# Patient Record
Sex: Female | Born: 1980 | Race: Black or African American | Hispanic: No | Marital: Single | Smoking: Never smoker
Health system: Southern US, Community
[De-identification: ages and names within clinical notes are randomized; demographics above are authoritative.]

## PROBLEM LIST (undated history)

## (undated) DIAGNOSIS — I10 Essential (primary) hypertension: Secondary | ICD-10-CM

## (undated) HISTORY — PX: GASTRIC BYPASS: SHX52

## (undated) HISTORY — PX: CHOLECYSTECTOMY: SHX55

---

## 2005-05-29 HISTORY — PX: ABDOMINAL HYSTERECTOMY: SHX81

## 2016-10-28 ENCOUNTER — Encounter (HOSPITAL_BASED_OUTPATIENT_CLINIC_OR_DEPARTMENT_OTHER): Payer: Self-pay | Admitting: Emergency Medicine

## 2016-10-28 ENCOUNTER — Emergency Department (HOSPITAL_BASED_OUTPATIENT_CLINIC_OR_DEPARTMENT_OTHER)
Admission: EM | Admit: 2016-10-28 | Discharge: 2016-10-28 | Disposition: A | Discharge status: Home / Self Care | Payer: No Typology Code available for payment source | Attending: Emergency Medicine | Admitting: Emergency Medicine

## 2016-10-28 DIAGNOSIS — I10 Essential (primary) hypertension: Secondary | ICD-10-CM | POA: Diagnosis not present

## 2016-10-28 DIAGNOSIS — G500 Trigeminal neuralgia: Secondary | ICD-10-CM | POA: Insufficient documentation

## 2016-10-28 DIAGNOSIS — Z79899 Other long term (current) drug therapy: Secondary | ICD-10-CM | POA: Diagnosis not present

## 2016-10-28 DIAGNOSIS — G518 Other disorders of facial nerve: Secondary | ICD-10-CM

## 2016-10-28 DIAGNOSIS — Y929 Unspecified place or not applicable: Secondary | ICD-10-CM | POA: Diagnosis not present

## 2016-10-28 DIAGNOSIS — M549 Dorsalgia, unspecified: Secondary | ICD-10-CM | POA: Diagnosis present

## 2016-10-28 DIAGNOSIS — Y999 Unspecified external cause status: Secondary | ICD-10-CM | POA: Insufficient documentation

## 2016-10-28 DIAGNOSIS — Y939 Activity, unspecified: Secondary | ICD-10-CM | POA: Diagnosis not present

## 2016-10-28 HISTORY — DX: Essential (primary) hypertension: I10

## 2016-10-28 MED ORDER — ACETAMINOPHEN 500 MG PO TABS: 1000.0000 mg | ORAL_TABLET | Freq: Four times a day (QID) | ORAL | 0 refills | Status: DC | PRN

## 2016-10-28 MED ORDER — LISINOPRIL-HYDROCHLOROTHIAZIDE 20-12.5 MG PO TABS: 1.0000 | ORAL_TABLET | Freq: Every day | ORAL | 0 refills | Status: AC
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
## 2016-10-28 NOTE — ED Notes (Signed)
Pt reports she recently moved here from [place] - she is prescribed Lisinopril/HCTZ for her BP but has not had med since moving, unable to obtain prescription. [place]

## 2016-12-25 ENCOUNTER — Emergency Department (HOSPITAL_COMMUNITY)
Admission: EM | Admit: 2016-12-25 | Discharge: 2016-12-25 | Disposition: A | Discharge status: Home / Self Care | Payer: BLUE CROSS/BLUE SHIELD | Attending: Emergency Medicine | Admitting: Emergency Medicine

## 2016-12-25 ENCOUNTER — Encounter (HOSPITAL_COMMUNITY): Payer: Self-pay | Admitting: Emergency Medicine

## 2016-12-25 ENCOUNTER — Emergency Department (HOSPITAL_COMMUNITY): Payer: BLUE CROSS/BLUE SHIELD

## 2016-12-25 DIAGNOSIS — Z79899 Other long term (current) drug therapy: Secondary | ICD-10-CM | POA: Diagnosis not present

## 2016-12-25 DIAGNOSIS — R001 Bradycardia, unspecified: Secondary | ICD-10-CM | POA: Diagnosis not present

## 2016-12-25 DIAGNOSIS — N83202 Unspecified ovarian cyst, left side: Secondary | ICD-10-CM | POA: Diagnosis not present

## 2016-12-25 DIAGNOSIS — R102 Pelvic and perineal pain: Secondary | ICD-10-CM

## 2016-12-25 DIAGNOSIS — R1032 Left lower quadrant pain: Secondary | ICD-10-CM | POA: Diagnosis present

## 2016-12-25 DIAGNOSIS — I10 Essential (primary) hypertension: Secondary | ICD-10-CM | POA: Diagnosis not present

## 2016-12-25 LAB — URINALYSIS, ROUTINE W REFLEX MICROSCOPIC
BILIRUBIN URINE: NEGATIVE
GLUCOSE, UA: NEGATIVE mg/dL
HGB URINE DIPSTICK: NEGATIVE
KETONES UR: NEGATIVE mg/dL
Leukocytes, UA: NEGATIVE
Nitrite: NEGATIVE
PH: 5 (ref 5.0–8.0)
Protein, ur: NEGATIVE mg/dL
SPECIFIC GRAVITY, URINE: 1.016 (ref 1.005–1.030)

## 2016-12-25 LAB — WET PREP, GENITAL
SPERM: NONE SEEN
Trich, Wet Prep: NONE SEEN
Yeast Wet Prep HPF POC: NONE SEEN

## 2016-12-25 LAB — COMPREHENSIVE METABOLIC PANEL
ALK PHOS: 36 U/L — AB (ref 38–126)
ALT: 11 U/L — AB (ref 14–54)
AST: 13 U/L — ABNORMAL LOW (ref 15–41)
Albumin: 3.7 g/dL (ref 3.5–5.0)
Anion gap: 7 (ref 5–15)
BUN: 8 mg/dL (ref 6–20)
CALCIUM: 9 mg/dL (ref 8.9–10.3)
CHLORIDE: 108 mmol/L (ref 101–111)
CO2: 26 mmol/L (ref 22–32)
CREATININE: 0.71 mg/dL (ref 0.44–1.00)
GFR calc Af Amer: >60 mL/min (ref >60)
GFR calc non Af Amer: >60 mL/min (ref >60)
GLUCOSE: 100 mg/dL — AB (ref 65–99)
Potassium: 3.7 mmol/L (ref 3.5–5.1)
Sodium: 141 mmol/L (ref 135–145)
Total Bilirubin: 0.8 mg/dL (ref 0.3–1.2)
Total Protein: 6.5 g/dL (ref 6.5–8.1)

## 2016-12-25 LAB — LIPASE, BLOOD: Lipase: 24 U/L (ref 11–51)

## 2016-12-25 LAB — GC/CHLAMYDIA PROBE AMP (~~LOC~~) NOT AT ARMC
Chlamydia: NEGATIVE
NEISSERIA GONORRHEA: NEGATIVE

## 2016-12-25 LAB — CBC
HCT: 34.4 % — ABNORMAL LOW (ref 36.0–46.0)
Hemoglobin: 11.4 g/dL — ABNORMAL LOW (ref 12.0–15.0)
MCH: 28.6 pg (ref 26.0–34.0)
MCHC: 33.1 g/dL (ref 30.0–36.0)
MCV: 86.2 fL (ref 78.0–100.0)
PLATELETS: 189 10*3/uL (ref 150–400)
RBC: 3.99 MIL/uL (ref 3.87–5.11)
RDW: 12.6 % (ref 11.5–15.5)
WBC: 8.3 10*3/uL (ref 4.0–10.5)

## 2016-12-25 MED ORDER — HYDROCODONE-ACETAMINOPHEN 5-325 MG PO TABS
1.0000 | ORAL_TABLET | Freq: Once | ORAL | Status: AC
  Administered 2016-12-25: 1 via ORAL
  Filled 2016-12-25: qty 1

## 2016-12-25 MED ORDER — NAPROXEN 500 MG PO TABS: 500.0000 mg | ORAL_TABLET | Freq: Two times a day (BID) | ORAL | 0 refills | Status: AC

## 2016-12-25 MED ORDER — ONDANSETRON 4 MG PO TBDP
4.0000 mg | ORAL_TABLET | Freq: Once | ORAL | Status: AC
  Administered 2016-12-25: 4 mg via ORAL
  Filled 2016-12-25: qty 1

## 2016-12-25 MED ORDER — HYDROCODONE-ACETAMINOPHEN 5-325 MG PO TABS: ORAL_TABLET | ORAL | 0 refills | Status: AC

## 2016-12-25 NOTE — ED Notes (Signed)
I will come back and draw labs,  Provider at bedside.

## 2016-12-25 NOTE — ED Provider Notes (Signed)
[facility] Provider Note   CSN: [id number] Arrival date & time: 12/25/16  4098     History   Chief Complaint Chief Complaint  Patient presents with  . Abdominal Pain    HPI Robin Lester is a 36 y.o. female.  Patient with history of cholecystectomy, hysterectomy due to vaginal bleeding after cesarean section, gastric sleeve -- presents with complaint of left lower abdominal/pelvic pain starting acutely at 3 AM. Pain woke patient from sleep. She was able to go back to sleep until 5 AM when she got up for work and the pain persisted, prompting emergency department evaluation. She has had associated nausea but no vomiting. No chest pain or shortness of breath. Pain radiates to her left flank. No changes in bowel movements with urination. Patient noted 2 days of white vaginal discharge. No vaginal bleeding. No diarrhea or constipation. No treatments prior to arrival. Patient has not had similar pain in the past.      Past Medical History:  Diagnosis Date  . Hypertension     There are no active problems to display for this patient.   Past Surgical History:  Procedure Laterality Date  . ABDOMINAL HYSTERECTOMY  2007  . CHOLECYSTECTOMY    . GASTRIC BYPASS      OB History    No data available       Home Medications    Prior to Admission medications   Medication Sig Start Date End Date Taking? Authorizing Provider  acetaminophen (TYLENOL) 500 MG tablet Take 2 tablets (1,000 mg total) by mouth every 6 (six) hours as needed. 10/28/16   Arby Barrette, MD  lisinopril-hydrochlorothiazide (ZESTORETIC) 20-12.5 MG tablet Take 1 tablet by mouth daily. 10/28/16   Arby Barrette, MD    Family History No family history on file.  Social History Social History  Substance Use Topics  . Smoking status: Never Smoker  . Smokeless tobacco: Never Used  . Alcohol use No     Allergies   Patient has no known allergies.   Review of Systems Review of Systems    Constitutional: Negative for fever.  HENT: Negative for rhinorrhea and sore throat.   Eyes: Negative for redness.  Respiratory: Negative for cough.   Cardiovascular: Negative for chest pain.  Gastrointestinal: Positive for abdominal pain. Negative for diarrhea, nausea and vomiting.  Genitourinary: Positive for flank pain, pelvic pain and vaginal discharge. Negative for dysuria, frequency and vaginal bleeding.  Musculoskeletal: Negative for myalgias.  Skin: Negative for rash.  Neurological: Negative for headaches.     Physical Exam Updated Vital Signs BP 136/87 (BP Location: Right Arm)   Pulse (!) 47   Temp 98.6 F (37 C) (Oral)   Resp 20   Ht 5\' 5"  (1.651 m)   Wt 72.6 kg (160 lb)   SpO2 98%   BMI 26.63 kg/m   Physical Exam  Constitutional: She appears well-developed and well-nourished.  HENT:  Head: Normocephalic and atraumatic.  Mouth/Throat: Oropharynx is clear and moist.  Eyes: Conjunctivae are normal. Right eye exhibits no discharge. Left eye exhibits no discharge.  Neck: Normal range of motion. Neck supple.  Cardiovascular: Normal rate, regular rhythm and normal heart sounds.   Pulmonary/Chest: Effort normal and breath sounds normal.  Abdominal: Soft. There is tenderness (mild) in the suprapubic area and left lower quadrant. There is no rebound and no guarding.    Genitourinary: Pelvic exam was performed with patient supine. Right adnexum displays no mass and no tenderness. Left adnexum displays tenderness. Left adnexum  displays no mass. No signs of injury around the vagina. Vaginal discharge (scant white) found.    Neurological: She is alert.  Skin: Skin is warm and dry.  Psychiatric: She has a normal mood and affect.  Nursing note and vitals reviewed.    ED Treatments / Results  Labs (all labs ordered are listed, but only abnormal results are displayed) Labs Reviewed  WET PREP, GENITAL - Abnormal; Notable for the following:       Result Value   Clue  Cells Wet Prep HPF POC PRESENT (*)    WBC, Wet Prep HPF POC MANY (*)    All other components within normal limits  COMPREHENSIVE METABOLIC PANEL - Abnormal; Notable for the following:    Glucose, Bld 100 (*)    AST 13 (*)    ALT 11 (*)    Alkaline Phosphatase 36 (*)    All other components within normal limits  CBC - Abnormal; Notable for the following:    Hemoglobin 11.4 (*)    HCT 34.4 (*)    All other components within normal limits  URINALYSIS, ROUTINE W REFLEX MICROSCOPIC - Abnormal; Notable for the following:    APPearance HAZY (*)    Bacteria, UA RARE (*)    Squamous Epithelial / LPF 0-5 (*)    All other components within normal limits  LIPASE, BLOOD  GC/CHLAMYDIA PROBE AMP ([facility]) NOT [facility]    EKG  EKG Interpretation None       Radiology [place] Transvaginal Non-ob  Result Date: 12/25/2016 CLINICAL DATA:  Left-sided pelvic pain began this morning. History of partial hysterectomy in 2007. EXAM: TRANSABDOMINAL AND TRANSVAGINAL ULTRASOUND OF PELVIS DOPPLER ULTRASOUND OF OVARIES TECHNIQUE: Both transabdominal and transvaginal ultrasound examinations of the pelvis were performed. Transabdominal technique was performed for global imaging of the pelvis including uterus, ovaries, adnexal regions, and pelvic cul-de-sac. It was necessary to proceed with endovaginal exam following the transabdominal exam to visualize the ovaries. Color and duplex Doppler ultrasound was utilized to evaluate blood flow to the ovaries. COMPARISON:  None. FINDINGS: Uterus The uterus is surgically absent. Right ovary Measurements: 2.9 x 1.3 x 2.5 cm. Normal appearance/no adnexal mass. Left ovary Measurements: 4.0 x 3.1 x 3.4 cm. There is a solid-appearing structure in the left ovary which is high so anechoic with other solid ovarian tissue. It measures 2.6 x 2 x 2.2 cm. There are developing follicles. The solid mass is not hypervascular. Pulsed Doppler evaluation of both ovaries demonstrates normal  low-resistance arterial and venous waveforms. Other findings There is a small amount of free pelvic fluid. IMPRESSION: Previous hysterectomy.  Normal-appearing right ovary. Solid isoechoic structure in the left ovary. This may reflect a hemorrhagic cyst. A dermoid or endometrioma is felt less likely. There are no findings to suggest a tubo-ovarian abscess or ectopic pregnancy. Correlation with the patient's laboratory values is needed. Follow-up ultrasound in 8-12 weeks is recommended unless there are clinical or laboratory findings worrisome for TOA or ectopic pregnancy. Electronically Signed   By: David  [place] M.D.   On: 12/25/2016 09:47   [place] Pelvis Complete  Result Date: 12/25/2016 CLINICAL DATA:  Left-sided pelvic pain began this morning. History of partial hysterectomy in 2007. EXAM: TRANSABDOMINAL AND TRANSVAGINAL ULTRASOUND OF PELVIS DOPPLER ULTRASOUND OF OVARIES TECHNIQUE: Both transabdominal and transvaginal ultrasound examinations of the pelvis were performed. Transabdominal technique was performed for global imaging of the pelvis including uterus, ovaries, adnexal regions, and pelvic cul-de-sac. It was necessary to proceed with endovaginal exam  following the transabdominal exam to visualize the ovaries. Color and duplex Doppler ultrasound was utilized to evaluate blood flow to the ovaries. COMPARISON:  None. FINDINGS: Uterus The uterus is surgically absent. Right ovary Measurements: 2.9 x 1.3 x 2.5 cm. Normal appearance/no adnexal mass. Left ovary Measurements: 4.0 x 3.1 x 3.4 cm. There is a solid-appearing structure in the left ovary which is high so anechoic with other solid ovarian tissue. It measures 2.6 x 2 x 2.2 cm. There are developing follicles. The solid mass is not hypervascular. Pulsed Doppler evaluation of both ovaries demonstrates normal low-resistance arterial and venous waveforms. Other findings There is a small amount of free pelvic fluid. IMPRESSION: Previous hysterectomy.   Normal-appearing right ovary. Solid isoechoic structure in the left ovary. This may reflect a hemorrhagic cyst. A dermoid or endometrioma is felt less likely. There are no findings to suggest a tubo-ovarian abscess or ectopic pregnancy. Correlation with the patient's laboratory values is needed. Follow-up ultrasound in 8-12 weeks is recommended unless there are clinical or laboratory findings worrisome for TOA or ectopic pregnancy. Electronically Signed   By: David  [place] M.D.   On: 12/25/2016 09:47   [place] Art/ven Flow Abd Pelv Doppler  Result Date: 12/25/2016 CLINICAL DATA:  Left-sided pelvic pain began this morning. History of partial hysterectomy in 2007. EXAM: TRANSABDOMINAL AND TRANSVAGINAL ULTRASOUND OF PELVIS DOPPLER ULTRASOUND OF OVARIES TECHNIQUE: Both transabdominal and transvaginal ultrasound examinations of the pelvis were performed. Transabdominal technique was performed for global imaging of the pelvis including uterus, ovaries, adnexal regions, and pelvic cul-de-sac. It was necessary to proceed with endovaginal exam following the transabdominal exam to visualize the ovaries. Color and duplex Doppler ultrasound was utilized to evaluate blood flow to the ovaries. COMPARISON:  None. FINDINGS: Uterus The uterus is surgically absent. Right ovary Measurements: 2.9 x 1.3 x 2.5 cm. Normal appearance/no adnexal mass. Left ovary Measurements: 4.0 x 3.1 x 3.4 cm. There is a solid-appearing structure in the left ovary which is high so anechoic with other solid ovarian tissue. It measures 2.6 x 2 x 2.2 cm. There are developing follicles. The solid mass is not hypervascular. Pulsed Doppler evaluation of both ovaries demonstrates normal low-resistance arterial and venous waveforms. Other findings There is a small amount of free pelvic fluid. IMPRESSION: Previous hysterectomy.  Normal-appearing right ovary. Solid isoechoic structure in the left ovary. This may reflect a hemorrhagic cyst. A dermoid or endometrioma  is felt less likely. There are no findings to suggest a tubo-ovarian abscess or ectopic pregnancy. Correlation with the patient's laboratory values is needed. Follow-up ultrasound in 8-12 weeks is recommended unless there are clinical or laboratory findings worrisome for TOA or ectopic pregnancy. Electronically Signed   By: David  [place] M.D.   On: 12/25/2016 09:47    Procedures Procedures (including critical care time)  Medications Ordered in ED Medications  ondansetron (ZOFRAN-ODT) disintegrating tablet 4 mg (4 mg Oral Given 12/25/16 0807)  HYDROcodone-acetaminophen (NORCO/VICODIN) 5-325 MG per tablet 1 tablet (1 tablet Oral Given 12/25/16 0807)     Initial Impression / Assessment and Plan / ED Course  I have reviewed the triage vital signs and the nursing notes.  Pertinent labs & imaging results that were available during my care of the patient were reviewed by me and considered in my medical decision making (see chart for details).     Vital signs reviewed and are as follows: Vitals:   12/25/16 0607 12/25/16 0652  BP: 133/89 136/87  Pulse: (!) 51 (!) 47  Resp:  20  Temp:  98.6 F (37 C)   7:44 AM Pelvic performed with RN chaperones. US ordered.  10:04 AM Pt updated, she is comfortable. Told she should be rechecked with repeat US in 2-3 months. In interim, d/c with naproxen, Vicodin for severe pain. [facility]   Patient counseled on use of narcotic pain medications. Counseled not to combine these medications with others containing tylenol. Urged not to drink alcohol, drive, or perform any other activities that requires focus while taking these medications. The patient verbalizes understanding and agrees with the plan.  The patient was urged to return to the Emergency Department immediately with worsening of current symptoms, worsening abdominal pain, persistent vomiting, blood noted in stools, fever, or any other concerns. The patient verbalized understanding.      Final Clinical Impressions(s) / ED Diagnoses   Final diagnoses:  Pelvic pain  Left ovarian cyst  Bradycardia   Patient with L pelvic pain. Vitals are stable, no fever. HR 40's to 50's, asymptomatic. HR 50 at last visit in June. Labs reassuring. Imaging probable L ovarian cyst. Clue cells on wet prep -- patient with minor non-bothersome discharge with non-characteristic findings for BV. No signs of dehydration, patient is tolerating PO's. Lungs are clear and no signs suggestive of PNA. Low concern for appendicitis, cholecystitis, pancreatitis, ruptured viscus, UTI, kidney stone, aortic dissection, aortic aneurysm or other emergent abdominal etiology. Pt is not pregnant, she does not have uterus. Supportive therapy indicated with return if symptoms worsen.    New Prescriptions New Prescriptions   HYDROCODONE-ACETAMINOPHEN (NORCO/VICODIN) 5-325 MG TABLET    Take 1-2 tablets every 6 hours as needed for severe pain   NAPROXEN (NAPROSYN) 500 MG TABLET    Take 1 tablet (500 mg total) by mouth 2 (two) times daily.     Renne CriglerGeiple, Catalina Salasar, PA-C 12/25/16 1009    Dione BoozeGlick, David, MD 12/27/16 2252

## 2016-12-25 NOTE — ED Triage Notes (Signed)
Pt in from home with c/o L sided lower abdomen/vaginal pain since 0300 this morning. Pt states pain is sharp and stabbing, denies n/v/d. Hx of hysterectomy in 07'. Alert, VSS

## 2016-12-25 NOTE — ED Notes (Signed)
Pt verbalized understanding of discharge instructions and is to follow-up with [facility] out-patient clinic. VSS Pt has no further questions

## 2016-12-25 NOTE — Discharge Instructions (Signed)
Please read and follow all provided instructions.  Your diagnoses today include:  1. Left ovarian cyst   2. Pelvic pain   3. Bradycardia     Tests performed today include:  Blood counts and electrolytes  Blood tests to check liver and kidney function  Urine test to look for infection  Ultrasound - shows a probable hemorrhagic cyst in the left ovary, will need recheck by a GYN  Vital signs. See below for your results today.   Medications prescribed:   Vicodin (hydrocodone/acetaminophen) - narcotic pain medication  DO NOT drive or perform any activities that require you to be awake and alert because this medicine can make you drowsy. BE VERY CAREFUL not to take multiple medicines containing Tylenol (also called acetaminophen). Doing so can lead to an overdose which can damage your liver and cause liver failure and possibly death.   Naproxen - anti-inflammatory pain medication  Do not exceed 500mg  naproxen every 12 hours, take with food  You have been prescribed an anti-inflammatory medication or NSAID. Take with food. Take smallest effective dose for the shortest duration needed for your pain. Stop taking if you experience stomach pain or vomiting.   Take any prescribed medications only as directed.  Home care instructions:   Follow any educational materials contained in this packet.  Follow-up instructions: Please follow-up with your in the next 7 days for further evaluation of your symptoms.    Return instructions:  SEEK IMMEDIATE MEDICAL ATTENTION IF:  The pain does not go away or becomes severe   A temperature above 101F develops   Repeated vomiting occurs (multiple episodes)   The pain becomes localized to portions of the abdomen. The right side could possibly be appendicitis. In an adult, the left lower portion of the abdomen could be colitis or diverticulitis.   Blood is being passed in stools or vomit (bright red or black tarry stools)   You develop chest  pain, difficulty breathing, dizziness or fainting, or become confused, poorly responsive, or inconsolable (young children)  If you have any other emergent concerns regarding your health   Your vital signs today were: BP 122/83    Pulse (!) 43    Temp 98.6 F (37 C) (Oral)    Resp 17    Ht 5\' 5"  (1.651 m)    Wt 72.6 kg (160 lb)    SpO2 95%    BMI 26.63 kg/m  If your blood pressure (bp) was elevated above 135/85 this visit, please have this repeated by your doctor within one month. --------------

## 2019-02-05 IMAGING — US US PELVIS COMPLETE
1 series · 13 of 25 positions shown · non-contrast
Comparison: None.

CLINICAL DATA: Left-sided pelvic pain began this morning. History
of partial hysterectomy in 8885.

EXAM:
TRANSABDOMINAL AND TRANSVAGINAL ULTRASOUND OF PELVIS
DOPPLER ULTRASOUND OF OVARIES
TECHNIQUE: Both transabdominal and transvaginal ultrasound examinations of the
pelvis were performed. Transabdominal technique was performed for
global imaging of the pelvis including uterus, ovaries, adnexal
regions, and pelvic cul-de-sac.
It was necessary to proceed with endovaginal exam following the
transabdominal exam to visualize the ovaries. Color and duplex
Doppler ultrasound was utilized to evaluate blood flow to the
ovaries.

[Series 1: us pelvis complete · 0.21mm/px · 13 of 78 slices shown]
[im 1/78]
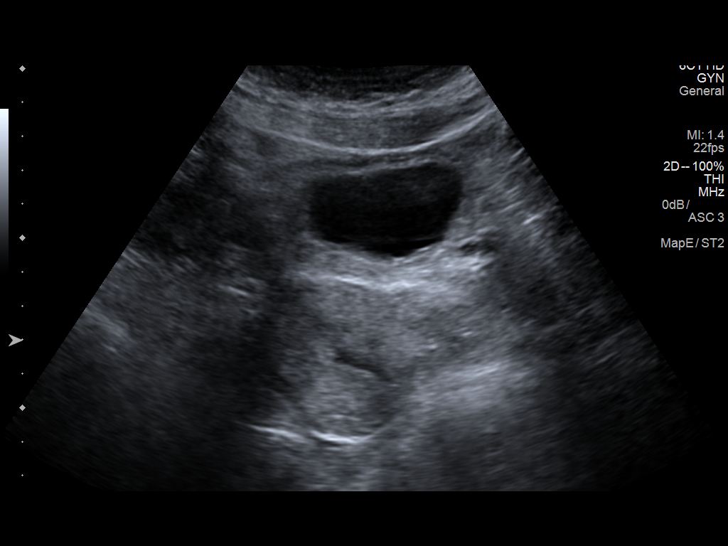
[im 7/78]
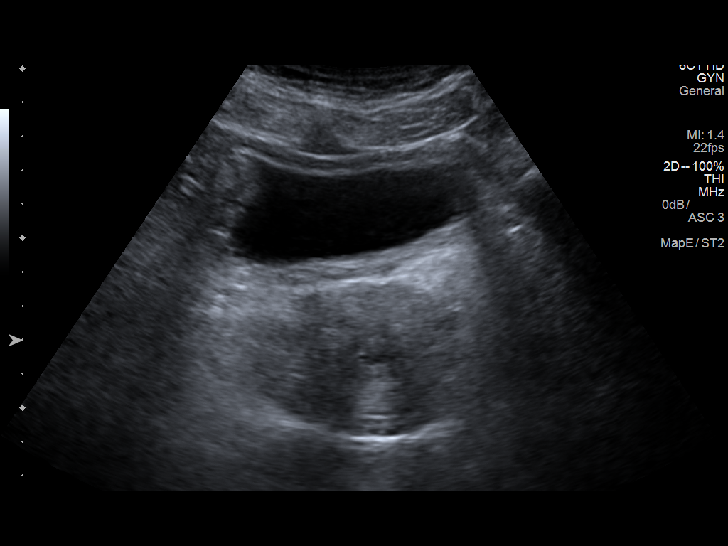
[im 13/78]
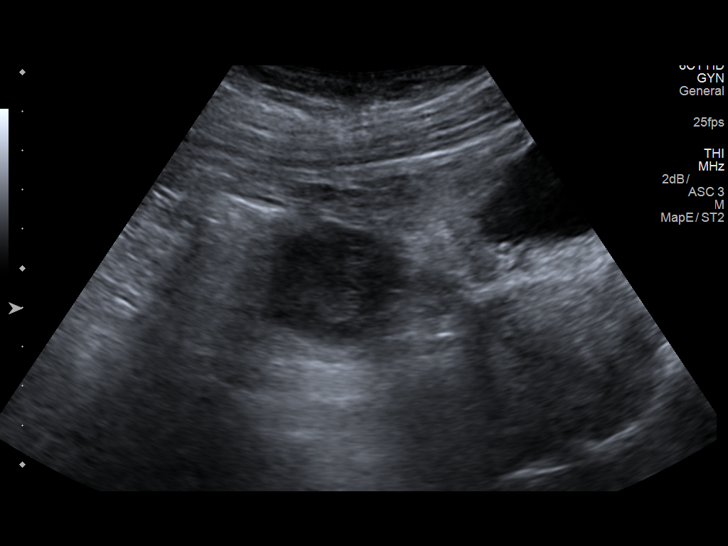
[im 20/78]
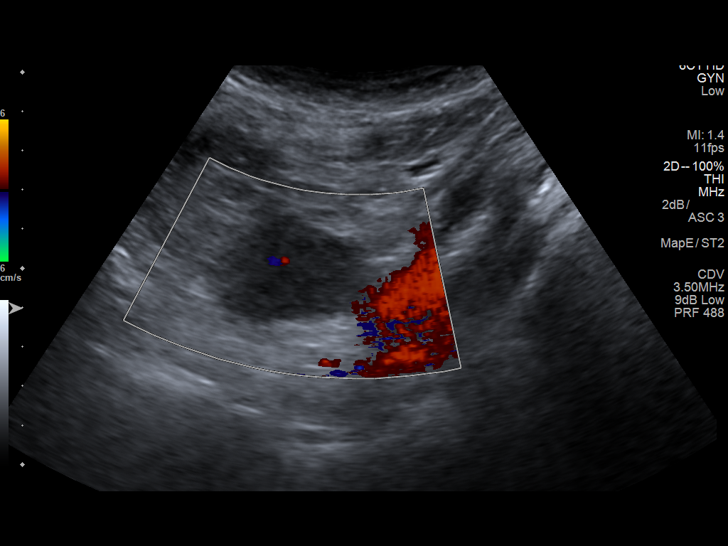
[im 26/78]
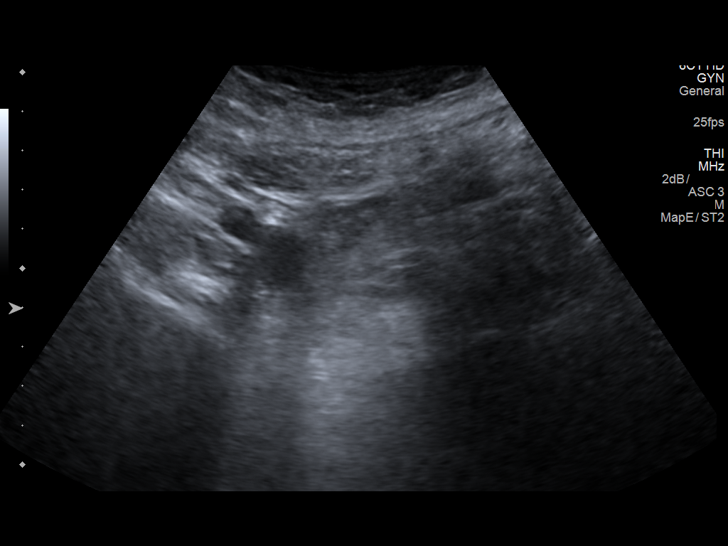
[im 33/78]
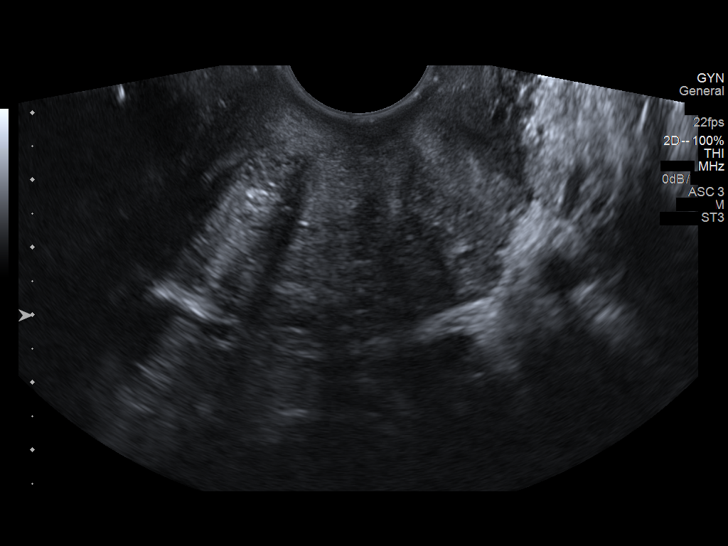
[im 39/78]
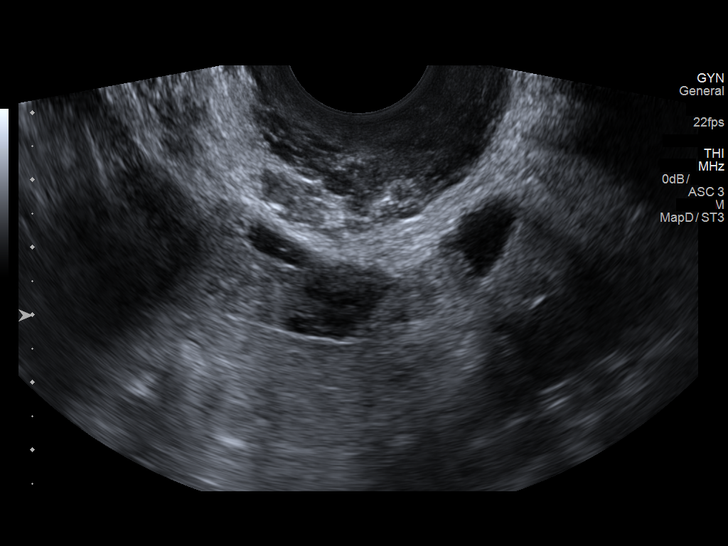
[im 45/78]
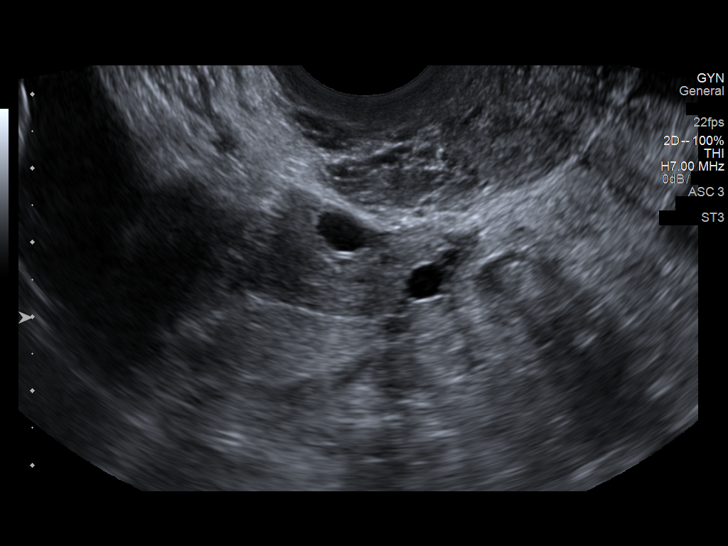
[im 52/78]
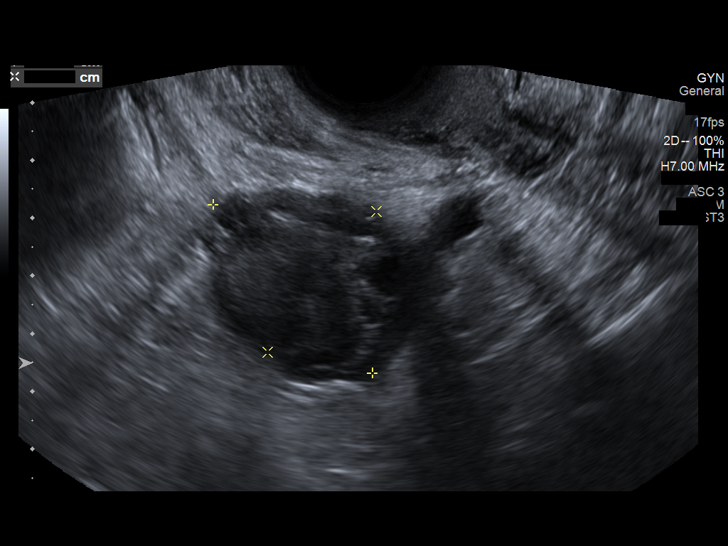
[im 58/78]
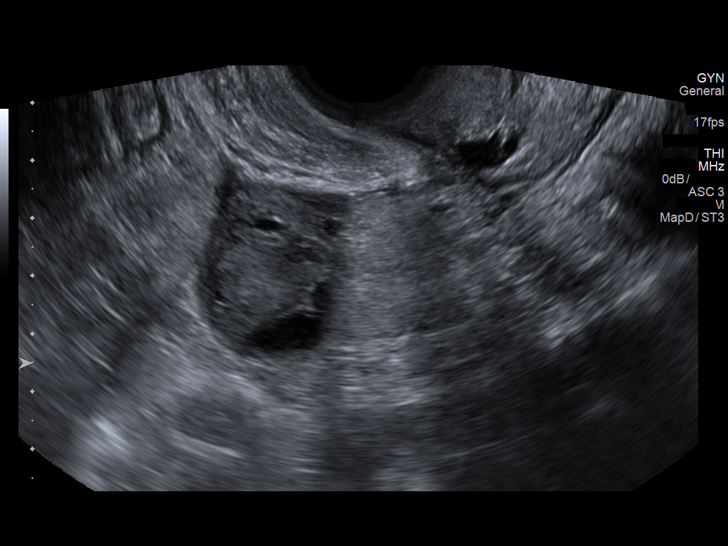
[im 65/78]
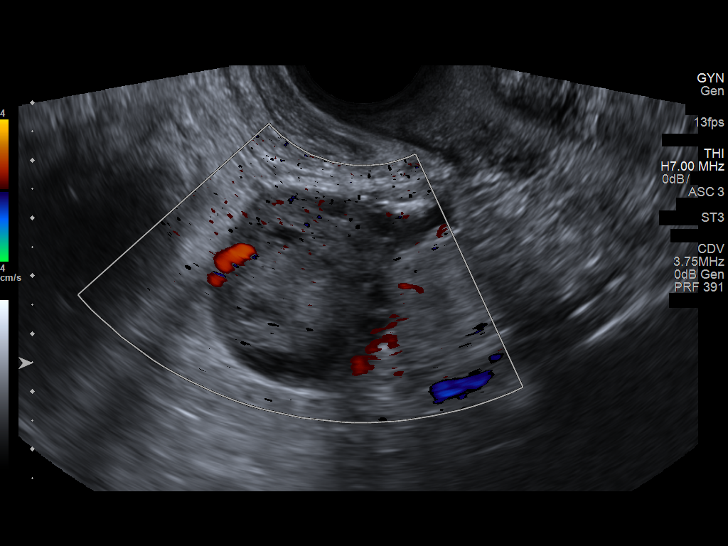
[im 71/78]
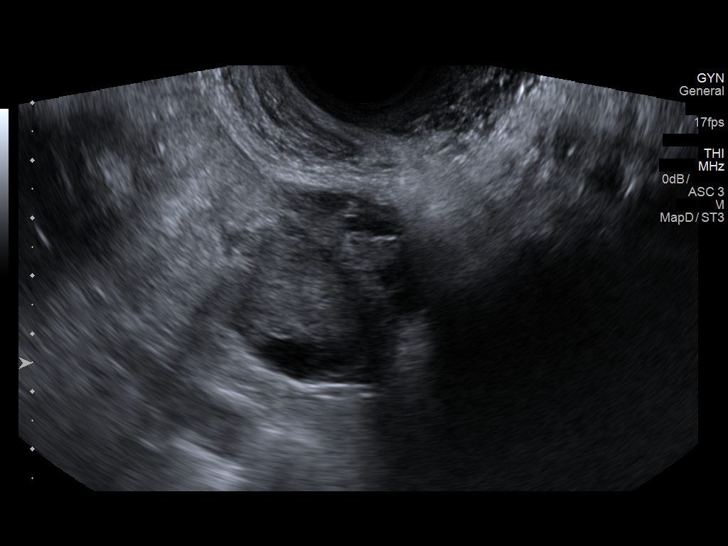
[im 78/78]
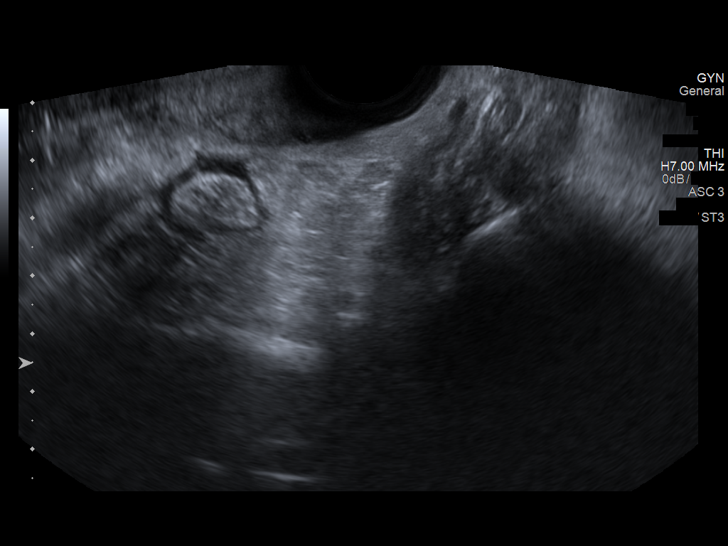

[13 of 25 positions shown; findings below may reference images not displayed]

FINDINGS: Uterus

The uterus is surgically absent.

Right ovary

Measurements: 2.9 x 1.3 x 2.5 cm. Normal appearance/no adnexal mass.

Left ovary

Measurements: 4.0 x 3.1 x 3.4 cm. There is a solid-appearing
structure in the left ovary which is high so anechoic with other
solid ovarian tissue. It measures 2.6 x 2 x 2.2 cm. There are
developing follicles. The solid mass is not hypervascular.

Pulsed Doppler evaluation of both ovaries demonstrates normal
low-resistance arterial and venous waveforms.

Other findings

There is a small amount of free pelvic fluid.
IMPRESSION: Previous hysterectomy.  Normal-appearing right ovary.

Solid isoechoic structure in the left ovary. This may reflect a
hemorrhagic cyst. A dermoid or endometrioma is felt less likely.
There are no findings to suggest a tubo-ovarian abscess or ectopic
pregnancy. Correlation with the patient's laboratory values is
needed. Follow-up ultrasound in 8-12 weeks is recommended unless
there are clinical or laboratory findings worrisome for TOA or
ectopic pregnancy.
